# Patient Record
Sex: Female | Born: 1960 | Race: White | Hispanic: No | Marital: Married
Health system: Southern US, Community
[De-identification: ages and names within clinical notes are randomized; demographics above are authoritative.]

---

## 2000-04-08 ENCOUNTER — Other Ambulatory Visit: Admission: RE | Admit: 2000-04-08 | Discharge: 2000-04-08 | Payer: Self-pay | Admitting: Obstetrics and Gynecology

## 2000-09-16 ENCOUNTER — Encounter: Payer: Self-pay | Admitting: Obstetrics and Gynecology

## 2000-09-16 ENCOUNTER — Ambulatory Visit (HOSPITAL_COMMUNITY): Admission: RE | Admit: 2000-09-16 | Discharge: 2000-09-16 | Payer: Self-pay | Admitting: Obstetrics and Gynecology

## 2001-05-05 ENCOUNTER — Other Ambulatory Visit: Admission: RE | Admit: 2001-05-05 | Discharge: 2001-05-05 | Payer: Self-pay | Admitting: Obstetrics and Gynecology

## 2002-08-27 ENCOUNTER — Other Ambulatory Visit: Admission: RE | Admit: 2002-08-27 | Discharge: 2002-08-27 | Payer: Self-pay | Admitting: Obstetrics and Gynecology

## 2003-11-23 ENCOUNTER — Ambulatory Visit: Payer: Self-pay | Admitting: Obstetrics and Gynecology

## 2004-03-29 ENCOUNTER — Emergency Department: Payer: Self-pay | Admitting: Unknown Physician Specialty

## 2004-12-24 ENCOUNTER — Ambulatory Visit: Payer: Self-pay | Admitting: Obstetrics and Gynecology

## 2006-01-02 ENCOUNTER — Ambulatory Visit: Payer: Self-pay | Admitting: Obstetrics and Gynecology

## 2006-01-06 IMAGING — CT CT CERVICAL SPINE WITHOUT CONTRAST
1 series · 12 of 14 positions shown, 15 images · non-contrast
Comparison: none

REASON FOR EXAM: Fall               rm cardiac
COMMENTS:

[Series 3: inspace · axial · 0.35mm/px · z∈[+210,+363]mm · 12 of 258 slices shown, 15 images]
[im 20/258  soft-tissue]
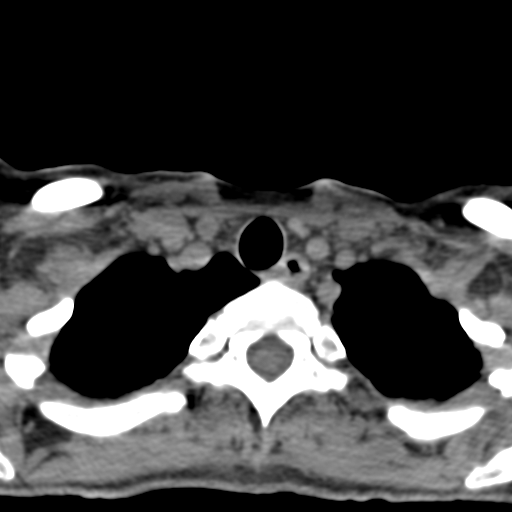
[im 20/258  bone]
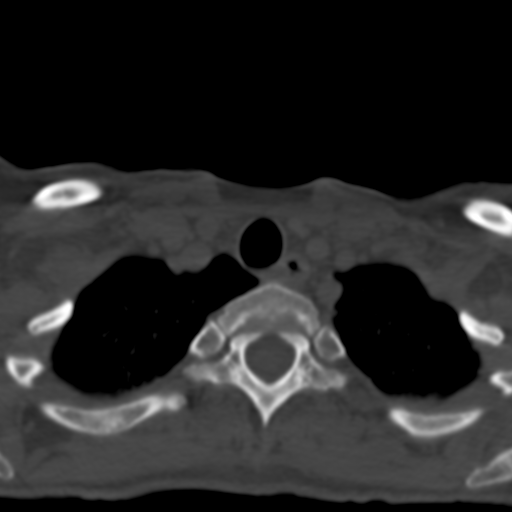
[im 40/258  bone]
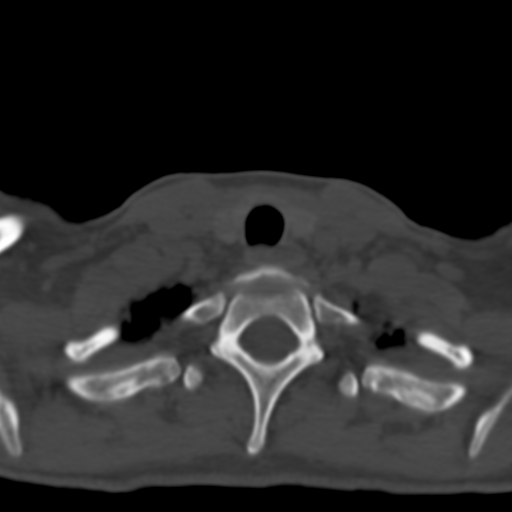
[im 60/258  bone]
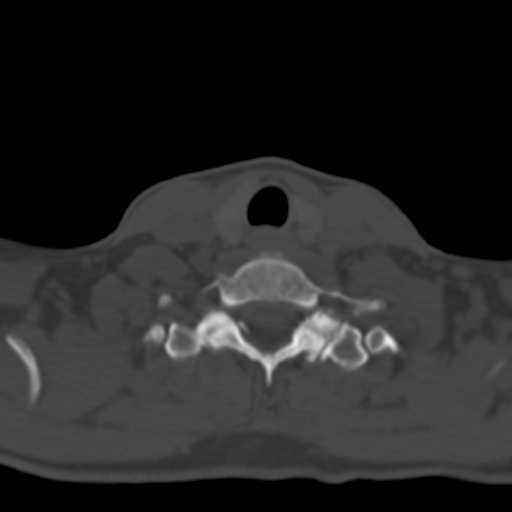
[im 80/258  bone]
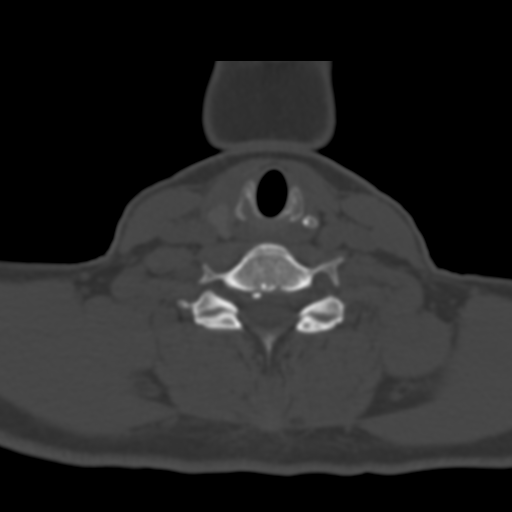
[im 99/258  soft-tissue]
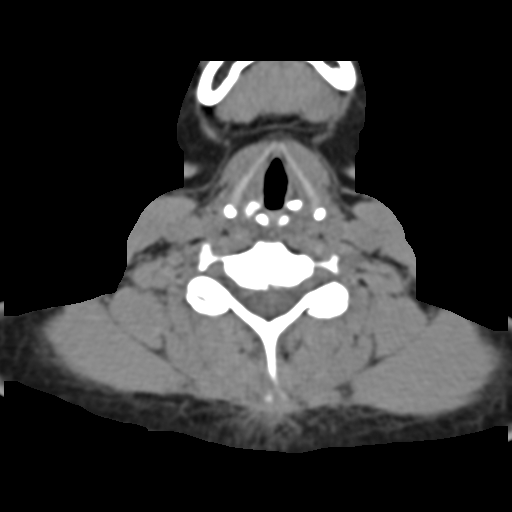
[im 99/258  bone]
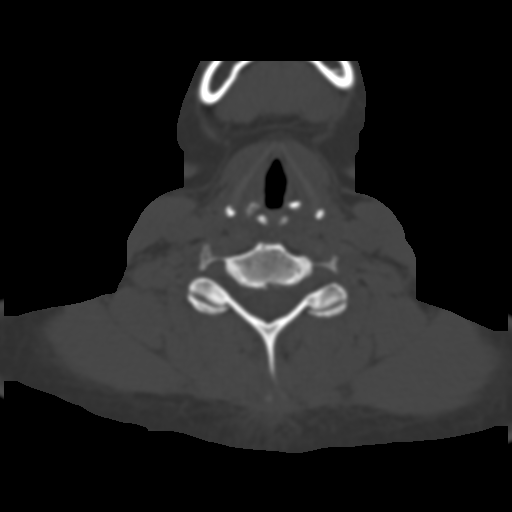
[im 119/258  bone]
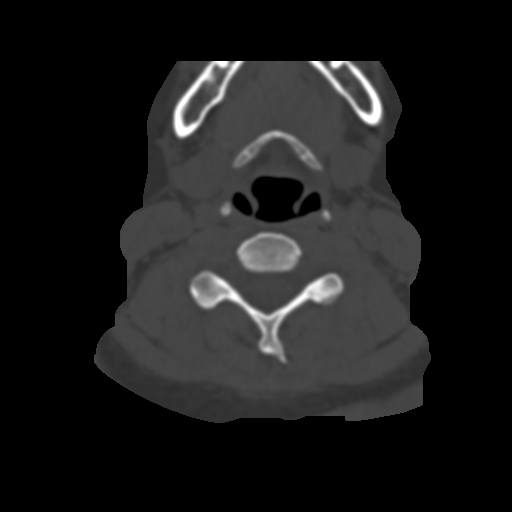
[im 139/258  bone]
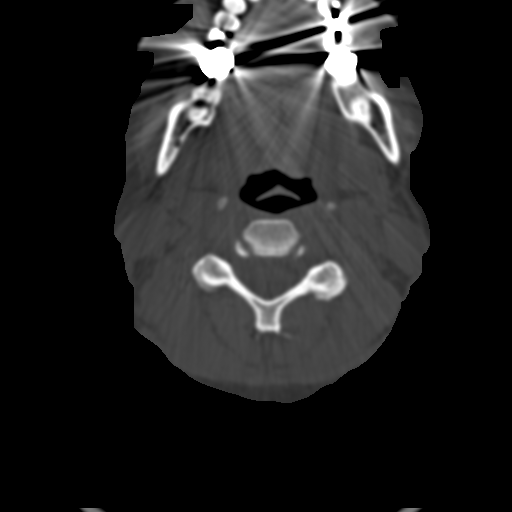
[im 159/258  bone]
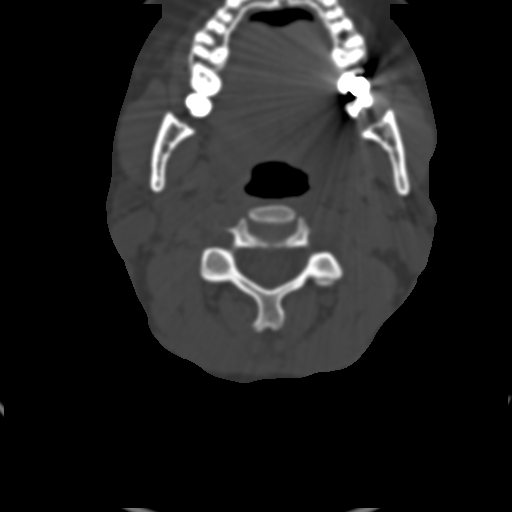
[im 178/258  soft-tissue]
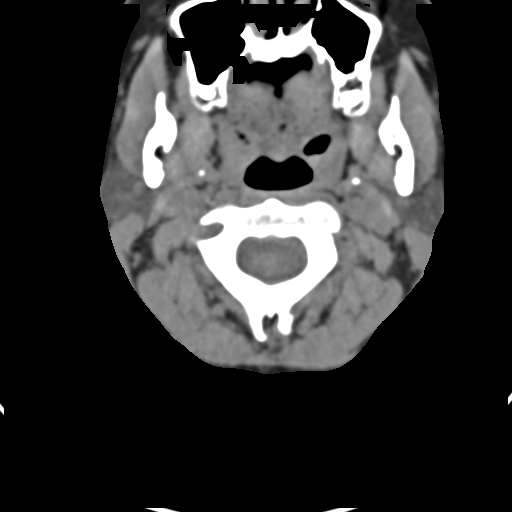
[im 178/258  bone]
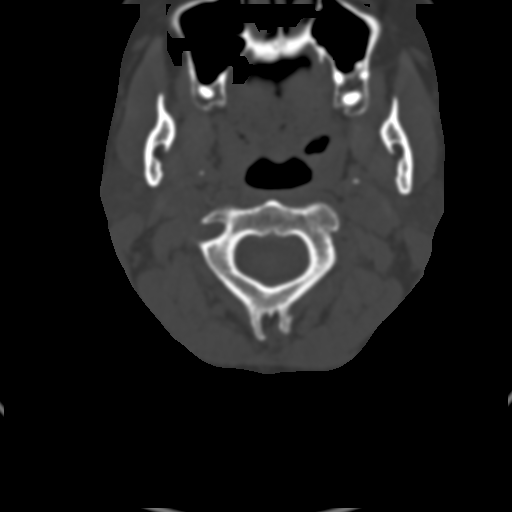
[im 198/258  bone]
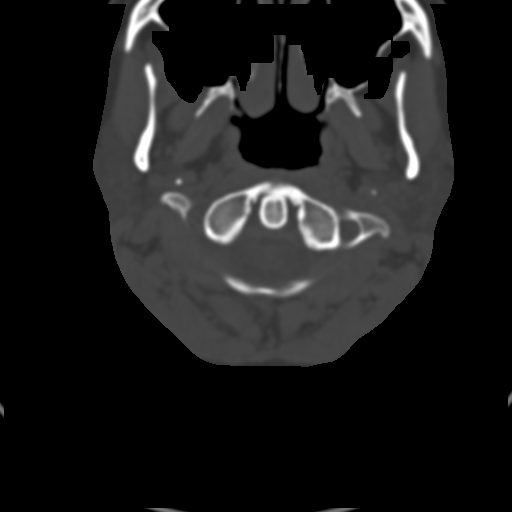
[im 218/258  bone]
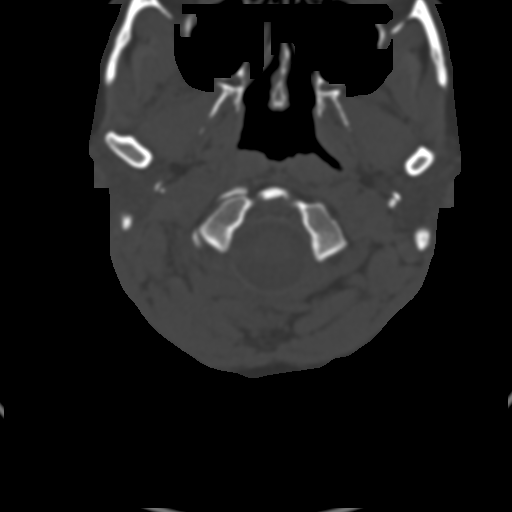
[im 238/258  bone]
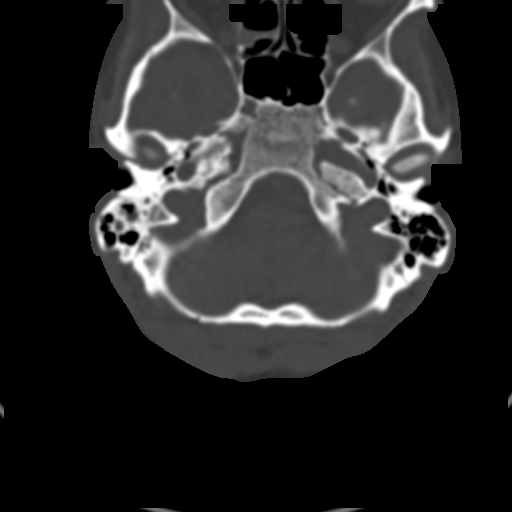

[12 of 14 positions shown; findings below may reference images not displayed]

PROCEDURE:     CT  - CT CERVICAL SPINE WO  - March 29, 2004 [DATE]

RESULT:     Multiplanar images were obtained of the cervical spine utilizing
high definition bone algorithm.

A skull base fracture is appreciated involving the RIGHT occipital condyle.
There is extension into the RIGHT anterior condylar region. These regions of
fracture are nondisplaced. Evaluation of the cervical spine proper
demonstrates no evidence of fracture or dislocation nor evidence of
prevertebral soft tissue swelling.
IMPRESSION: 1.     Fracture along the skull base involving the RIGHT condylar region as
described above. This is a nondisplaced fracture.
2.     No evidence of a cervical spine fracture.
3.     A preliminary report was relayed to Dr. Lika of the Emergency

## 2007-01-06 ENCOUNTER — Ambulatory Visit: Payer: Self-pay | Admitting: Obstetrics and Gynecology

## 2008-01-12 ENCOUNTER — Ambulatory Visit: Payer: Self-pay | Admitting: Obstetrics and Gynecology

## 2009-01-12 ENCOUNTER — Ambulatory Visit: Payer: Self-pay | Admitting: Obstetrics and Gynecology

## 2010-02-02 ENCOUNTER — Ambulatory Visit: Payer: Self-pay | Admitting: Obstetrics and Gynecology

## 2011-04-17 ENCOUNTER — Ambulatory Visit: Payer: Self-pay | Admitting: Obstetrics and Gynecology

## 2012-04-17 ENCOUNTER — Ambulatory Visit: Payer: Self-pay | Admitting: Obstetrics and Gynecology

## 2013-06-04 ENCOUNTER — Ambulatory Visit: Payer: Self-pay | Admitting: Obstetrics and Gynecology

## 2014-10-03 DIAGNOSIS — D239 Other benign neoplasm of skin, unspecified: Secondary | ICD-10-CM

## 2014-10-03 HISTORY — DX: Other benign neoplasm of skin, unspecified: D23.9

## 2019-04-01 ENCOUNTER — Ambulatory Visit: Payer: Self-pay | Attending: Internal Medicine

## 2019-04-01 DIAGNOSIS — Z23 Encounter for immunization: Secondary | ICD-10-CM

## 2019-04-01 NOTE — Progress Notes (Signed)
   Covid-19 Vaccination Clinic  Name:  Alexandria Shelton    MRN: [id number] DOB: 07-Feb-1960  04/01/2019  [name] was observed post Covid-19 immunization for 15 minutes without incident. She was provided with Vaccine Information Sheet and instruction to access the V-Safe system.   [name] was instructed to call 911 with any severe reactions post vaccine: Marland Kitchen Difficulty breathing  . Swelling of face and throat  . A fast heartbeat  . A bad rash all over body  . Dizziness and weakness   Immunizations Administered    Name Date Dose VIS Date Route   Pfizer COVID-19 Vaccine 04/01/2019  8:47 AM 0.3 mL 12/25/2018 Intramuscular   Manufacturer: [place]   Lot: [id number]   [place]: [id number]

## 2019-04-28 ENCOUNTER — Ambulatory Visit: Payer: Self-pay | Attending: Internal Medicine

## 2019-04-28 DIAGNOSIS — Z23 Encounter for immunization: Secondary | ICD-10-CM

## 2019-04-28 NOTE — Progress Notes (Signed)
   Covid-19 Vaccination Clinic  Name:  Alexandria Shelton    MRN: [id number] DOB: [birth year]  04/28/2019  [name] was observed post Covid-19 immunization for 15 minutes without incident. She was provided with Vaccine Information Sheet and instruction to access the V-Safe system.   [name] was instructed to call 911 with any severe reactions post vaccine: Marland Kitchen Difficulty breathing  . Swelling of face and throat  . A fast heartbeat  . A bad rash all over body  . Dizziness and weakness   Immunizations Administered    Name Date Dose VIS Date Route   Pfizer COVID-19 Vaccine 04/28/2019  9:22 AM 0.3 mL 12/25/2018 Intramuscular   Manufacturer: [place]   Lot: [id number]   [place]: [id number]

## 2020-12-18 ENCOUNTER — Other Ambulatory Visit: Payer: Self-pay

## 2020-12-18 ENCOUNTER — Encounter: Payer: Self-pay | Admitting: Dermatology

## 2020-12-18 ENCOUNTER — Ambulatory Visit: Payer: Managed Care, Other (non HMO) | Admitting: Dermatology

## 2020-12-18 DIAGNOSIS — D229 Melanocytic nevi, unspecified: Secondary | ICD-10-CM

## 2020-12-18 DIAGNOSIS — Z86018 Personal history of other benign neoplasm: Secondary | ICD-10-CM

## 2020-12-18 DIAGNOSIS — L570 Actinic keratosis: Secondary | ICD-10-CM | POA: Diagnosis not present

## 2020-12-18 DIAGNOSIS — L814 Other melanin hyperpigmentation: Secondary | ICD-10-CM

## 2020-12-18 DIAGNOSIS — Z85828 Personal history of other malignant neoplasm of skin: Secondary | ICD-10-CM | POA: Diagnosis not present

## 2020-12-18 DIAGNOSIS — Z1283 Encounter for screening for malignant neoplasm of skin: Secondary | ICD-10-CM | POA: Diagnosis not present

## 2020-12-18 DIAGNOSIS — D1801 Hemangioma of skin and subcutaneous tissue: Secondary | ICD-10-CM

## 2020-12-18 DIAGNOSIS — L578 Other skin changes due to chronic exposure to nonionizing radiation: Secondary | ICD-10-CM

## 2020-12-18 DIAGNOSIS — L820 Inflamed seborrheic keratosis: Secondary | ICD-10-CM

## 2020-12-18 DIAGNOSIS — B078 Other viral warts: Secondary | ICD-10-CM

## 2020-12-18 DIAGNOSIS — L821 Other seborrheic keratosis: Secondary | ICD-10-CM

## 2020-12-18 NOTE — Progress Notes (Signed)
Follow-Up Visit   Subjective  Alexandria A Largent is a 60 y.o. female who presents for the following: Annual Exam (Patient here for full body skin exam and skin cancer screening. Patient with hx of dysplastic nevi and BCC. She does have a spot at the bottom of her right foot that she thinks may be a wart. Otherwise no new or changing spots that patient is aware of. ).  The following portions of the chart were reviewed this encounter and updated as appropriate:   Allergies  Meds  Problems  Med Hx  Surg Hx  Fam Hx      Review of Systems:  No other skin or systemic complaints except as noted in HPI or Assessment and Plan.  Objective  Well appearing patient in no apparent distress; mood and affect are within normal limits.  A full examination was performed including scalp, head, eyes, ears, nose, lips, neck, chest, axillae, abdomen, back, buttocks, bilateral upper extremities, bilateral lower extremities, hands, feet, fingers, toes, fingernails, and toenails. All findings within normal limits unless otherwise noted below.  Left Dorsum Hand x 1 Erythematous keratotic or waxy stuck-on papule or plaque.   Left Wrist x 1 Erythematous thin papules/macules with gritty scale.   Right Foot (3) Verrucous papules -- Discussed viral etiology and contagion.    Assessment & Plan  Inflamed seborrheic keratosis Left Dorsum Hand x 1  Destruction of lesion - Left Dorsum Hand x 1 Complexity: simple   Destruction method: cryotherapy   Informed consent: discussed and consent obtained   Timeout:  patient name, date of birth, surgical site, and procedure verified Lesion destroyed using liquid nitrogen: Yes   Region frozen until ice ball extended beyond lesion: Yes   Outcome: patient tolerated procedure well with no complications   Post-procedure details: wound care instructions given    AK (actinic keratosis) Left Wrist x 1  Destruction of lesion - Left Wrist x 1 Complexity: simple    Destruction method: cryotherapy   Informed consent: discussed and consent obtained   Timeout:  patient name, date of birth, surgical site, and procedure verified Lesion destroyed using liquid nitrogen: Yes   Region frozen until ice ball extended beyond lesion: Yes   Outcome: patient tolerated procedure well with no complications   Post-procedure details: wound care instructions given    Other viral warts (3) Right Foot  Discussed viral etiology and risk of spread.  Discussed multiple treatments may be required to clear warts.  Discussed possible post-treatment dyspigmentation and risk of recurrence.  Start [facility] wart paste nightly and cover with band-aid.   Destruction of lesion - Right Foot Complexity: simple   Destruction method: cryotherapy   Informed consent: discussed and consent obtained   Timeout:  patient name, date of birth, surgical site, and procedure verified Lesion destroyed using liquid nitrogen: Yes   Region frozen until ice ball extended beyond lesion: Yes   Outcome: patient tolerated procedure well with no complications   Post-procedure details: wound care instructions given    Skin cancer screening  Lentigines - Scattered tan macules - Due to sun exposure - Benign-appearing, observe - Recommend daily broad spectrum sunscreen SPF 30+ to sun-exposed areas, reapply every 2 hours as needed. - Call for any changes  Seborrheic Keratoses - Stuck-on, waxy, tan-brown papules and/or plaques  - Benign-appearing - Discussed benign etiology and prognosis. - Observe - Call for any changes  Melanocytic Nevi - Tan-brown and/or pink-flesh-colored symmetric macules and papules - Benign appearing on exam today -  Observation - Call clinic for new or changing moles - Recommend daily use of broad spectrum spf 30+ sunscreen to sun-exposed areas.   Hemangiomas - Red papules - Discussed benign nature - Observe - Call for any changes  Actinic Damage - Chronic  condition, secondary to cumulative UV/sun exposure - diffuse scaly erythematous macules with underlying dyspigmentation - Recommend daily broad spectrum sunscreen SPF 30+ to sun-exposed areas, reapply every 2 hours as needed.  - Staying in the shade or wearing long sleeves, sun glasses (UVA+UVB protection) and wide brim hats (4-inch brim around the entire circumference of the hat) are also recommended for sun protection.  - Call for new or changing lesions.  Skin cancer screening performed today.  History of Basal Cell Carcinoma of the Skin - No evidence of recurrence today - Recommend regular full body skin exams - Recommend daily broad spectrum sunscreen SPF 30+ to sun-exposed areas, reapply every 2 hours as needed.  - Call if any new or changing lesions are noted between office visits  History of Dysplastic Nevi - No evidence of recurrence today - Recommend regular full body skin exams - Recommend daily broad spectrum sunscreen SPF 30+ to sun-exposed areas, reapply every 2 hours as needed.  - Call if any new or changing lesions are noted between office visits  Return for Wart 6-8 weeks, 1 year TBSE.  Graciella Belton, RMA, am acting as scribe for Sarina Ser, MD . Documentation: I have reviewed the above documentation for accuracy and completeness, and I agree with the above.  Sarina Ser, MD

## 2020-12-18 NOTE — Patient Instructions (Addendum)
Cryotherapy Aftercare  Wash gently with soap and water everyday.   Apply Vaseline and Band-Aid daily until healed.   Instructions for [facility] Medications  One or more of your medications was sent to the [facility] mail order compounding pharmacy. You will receive an email from them and can purchase the medicine through that link. It will then be mailed to your home at the address you confirmed. If for any reason you do not receive an email from them, please check your spam folder. If you still do not find the email, please let us know. [facility] phone number is [phone].  Start [facility] wart paste nightly and cover.   Melanoma ABCDEs  Melanoma is the most dangerous type of skin cancer, and is the leading cause of death from skin disease.  You are more likely to develop melanoma if you: Have light-colored skin, light-colored eyes, or red or blond hair Spend a lot of time in the sun Tan regularly, either outdoors or in a tanning bed Have had blistering sunburns, especially during childhood Have a close family member who has had a melanoma Have atypical moles or large birthmarks  Early detection of melanoma is key since treatment is typically straightforward and cure rates are extremely high if we catch it early.   The first sign of melanoma is often a change in a mole or a new dark spot.  The ABCDE system is a way of remembering the signs of melanoma.  A for asymmetry:  The two halves do not match. B for border:  The edges of the growth are irregular. C for color:  A mixture of colors are present instead of an even brown color. D for diameter:  Melanomas are usually (but not always) greater than 63mm - the size of a pencil eraser. E for evolution:  The spot keeps changing in size, shape, and color.  Please check your skin once per month between visits. You can use a small mirror in front and a large mirror behind you to keep an eye on the back side or your  body.   If you see any new or changing lesions before your next follow-up, please call to schedule a visit.  Please continue daily skin protection including broad spectrum sunscreen SPF 30+ to sun-exposed areas, reapplying every 2 hours as needed when you're outdoors.    If You Need Anything After Your Visit  If you have any questions or concerns for your doctor, please call our main line at [phone] and press option 4 to reach your doctor's medical assistant. If no one answers, please leave a voicemail as directed and we will return your call as soon as possible. Messages left after 4 pm will be answered the following business day.   You may also send [place] a message via [place]. We typically respond to MyChart messages within 1-2 business days.  For prescription refills, please ask your pharmacy to contact our office. Our fax number is [phone].  If you have an urgent issue when the clinic is closed that cannot wait until the next business day, you can page your doctor at the number below.    Please note that while we do our best to be available for urgent issues outside of office hours, we are not available 24/7.   If you have an urgent issue and are unable to reach [place], you may choose to seek medical care at your doctor's office, retail clinic, urgent care center, or emergency  room.  If you have a medical emergency, please immediately call 911 or go to the emergency department.  Pager Numbers  - Dr. Nehemiah Massed: [phone]  - Dr. Laurence Ferrari: [phone]  - Dr. Nicole Kindred: [phone]  In the event of inclement weather, please call our main line at [phone] for an update on the status of any delays or closures.  Dermatology Medication Tips: Please keep the boxes that topical medications come in in order to help keep track of the instructions about where and how to use these. Pharmacies typically print the medication instructions only on the boxes and not directly on the medication  tubes.   If your medication is too expensive, please contact our office at [phone] option 4 or send [place] a message through [place].   We are unable to tell what your co-pay for medications will be in advance as this is different depending on your insurance coverage. However, we may be able to find a substitute medication at lower cost or fill out paperwork to get insurance to cover a needed medication.   If a prior authorization is required to get your medication covered by your insurance company, please allow [place] 1-2 business days to complete this process.  Drug prices often vary depending on where the prescription is filled and some pharmacies may offer cheaper prices.  The website www.goodrx.com contains coupons for medications through different pharmacies. The prices here do not account for what the cost may be with help from insurance (it may be cheaper with your insurance), but the website can give you the price if you did not use any insurance.  - You can print the associated coupon and take it with your prescription to the pharmacy.  - You may also stop by our office during regular business hours and pick up a GoodRx coupon card.  - If you need your prescription sent electronically to a different pharmacy, notify our office through [facility] or by phone at [phone] option 4.     Si Usted Necesita Algo Despus de Su Visita  Tambin puede enviarnos un mensaje a travs de Pharmacist, community. Por lo general respondemos a los mensajes de MyChart en el transcurso de 1 a 2 das hbiles.  Para renovar recetas, por favor pida a su farmacia que se ponga en contacto con nuestra oficina. Harland Dingwall de fax es [place] [phone].  Si tiene un asunto urgente cuando la clnica est cerrada y que no puede esperar hasta el siguiente da hbil, puede llamar/localizar a su doctor(a) al nmero que aparece a continuacin.   Por favor, tenga en cuenta que aunque hacemos todo lo posible para estar  disponibles para asuntos urgentes fuera del horario de [place], no estamos disponibles las 24 horas del da, los 7 das de la [place].   Si tiene un problema urgente y no puede comunicarse con nosotros, puede optar por buscar atencin mdica  en el consultorio de su doctor(a), en una clnica privada, en un centro de atencin urgente o en una sala de emergencias.  Si tiene Engineering geologist, por favor llame inmediatamente al 911 o vaya a la sala de emergencias.  Nmeros de bper  - Dr. Nehemiah Massed: [phone]  - Dra. Moye: [phone]  - Dra. Nicole Kindred: [phone]  En caso de inclemencias del [place], por favor llame a Johnsie Kindred principal al [phone] para una actualizacin sobre el [place] de cualquier retraso o cierre.  Consejos para la medicacin en dermatologa: Por favor, guarde las cajas en las que vienen los  medicamentos de uso tpico para ayudarle a seguir las [employer] dnde y cmo usarlos. Las farmacias generalmente imprimen las instrucciones del medicamento slo en las cajas y no directamente en los tubos del [place].   Si su medicamento es muy caro, por favor, pngase en contacto con Zigmund Daniel llamando al [phone] y presione la opcin 4 o envenos un mensaje a travs de Pharmacist, community.   No podemos decirle cul ser su copago por los medicamentos por adelantado ya que esto es diferente dependiendo de la cobertura de su seguro. Sin embargo, es posible que podamos encontrar un medicamento sustituto a Electrical engineer un formulario para que el seguro cubra el medicamento que se considera necesario.   Si se requiere una autorizacin previa para que su compaa de seguros [place] su medicamento, por favor permtanos de 1 a 2 das hbiles para completar este proceso.  Los precios de los medicamentos varan con frecuencia dependiendo del Environmental consultant de dnde se surte la receta y alguna farmacias pueden ofrecer precios ms baratos.  El sitio web www.goodrx.com tiene  cupones para medicamentos de Airline pilot. Los precios aqu no tienen en cuenta lo que podra costar con la ayuda del seguro (puede ser ms barato con su seguro), pero el sitio web puede darle el precio si no utiliz Research scientist (physical sciences).  - Puede imprimir el cupn correspondiente y llevarlo con su receta a la farmacia.  - Tambin puede pasar por nuestra oficina durante el horario de atencin regular y Charity fundraiser una tarjeta de cupones de GoodRx.  - Si necesita que su receta se enve electrnicamente a una farmacia diferente, informe a nuestra oficina a travs de MyChart de [place] o por telfono llamando al [phone] y presione la opcin 4.

## 2020-12-27 ENCOUNTER — Encounter: Payer: Self-pay | Admitting: Dermatology

## 2021-02-05 ENCOUNTER — Other Ambulatory Visit: Payer: Self-pay

## 2021-02-05 ENCOUNTER — Ambulatory Visit (INDEPENDENT_AMBULATORY_CARE_PROVIDER_SITE_OTHER): Payer: Managed Care, Other (non HMO) | Admitting: Dermatology

## 2021-02-05 DIAGNOSIS — L280 Lichen simplex chronicus: Secondary | ICD-10-CM

## 2021-02-05 DIAGNOSIS — L820 Inflamed seborrheic keratosis: Secondary | ICD-10-CM | POA: Diagnosis not present

## 2021-02-05 DIAGNOSIS — L988 Other specified disorders of the skin and subcutaneous tissue: Secondary | ICD-10-CM | POA: Diagnosis not present

## 2021-02-05 DIAGNOSIS — B070 Plantar wart: Secondary | ICD-10-CM | POA: Diagnosis not present

## 2021-02-05 NOTE — Patient Instructions (Signed)

## 2021-02-05 NOTE — Progress Notes (Signed)
° °  Follow-Up Visit   Subjective  Alexandria Shelton is a 61 y.o. female who presents for the following: Warts (6 weeks f/u on warts on the right foot ). The patient has spots, moles and lesions to be evaluated, some may be new or changing and the patient has concerns that these could be cancer.   The following portions of the chart were reviewed this encounter and updated as appropriate:   Allergies   Meds   Problems   Med Hx   Surg Hx   Fam Hx       Review of Systems:  No other skin or systemic complaints except as noted in HPI or Assessment and Plan.  Objective  Well appearing patient in no apparent distress; mood and affect are within normal limits.  A focused examination was performed including hands, wrist, right foot, neck. Relevant physical exam findings are noted in the Assessment and Plan.  left hand, right wrist Non healing wound   left proximal mandible x 1 Stuck-on, waxy, tan-brown papule   face Rhytides and volume loss.   right foot x 1 Verrucous papules -- Discussed viral etiology and contagion.    Assessment & Plan  Lichen simplex chronicus left hand, right wrist  Chronic and persistent   Cleansed skin with Puracyn, applied thin DuoDerm pads  Inflamed seborrheic keratosis left proximal mandible x 1  Reassured benign age-related growth.  Recommend observation.  Discussed cryotherapy if spot(s) become irritated or inflamed.   Destruction of lesion - left proximal mandible x 1 Complexity: simple   Destruction method: cryotherapy   Informed consent: discussed and consent obtained   Timeout:  patient name, date of birth, surgical site, and procedure verified Lesion destroyed using liquid nitrogen: Yes   Region frozen until ice ball extended beyond lesion: Yes   Outcome: patient tolerated procedure well with no complications   Post-procedure details: wound care instructions given    Elastosis of skin face  Recommend botox  Frown complex 27.5 units   Forehead  5 units   Plantar wart right foot x 1  Discussed viral etiology and risk of spread.  Discussed multiple treatments may be required to clear warts.  Discussed possible post-treatment dyspigmentation and risk of recurrence.   Start [facility] wart paste after blistering process from today's procedure   Destruction of lesion - right foot x 1  Destruction method: cryotherapy   Informed consent: discussed and consent obtained   Lesion destroyed using liquid nitrogen: Yes   Region frozen until ice ball extended beyond lesion: Yes   Outcome: patient tolerated procedure well with no complications   Post-procedure details: wound care instructions given     Return for for botox .  IMarye Round, CMA, am acting as scribe for Sarina Ser, MD .  Documentation: I have reviewed the above documentation for accuracy and completeness, and I agree with the above.  Sarina Ser, MD

## 2021-02-08 ENCOUNTER — Encounter: Payer: Self-pay | Admitting: Dermatology

## 2021-02-21 ENCOUNTER — Ambulatory Visit (INDEPENDENT_AMBULATORY_CARE_PROVIDER_SITE_OTHER): Payer: Self-pay | Admitting: Dermatology

## 2021-02-21 ENCOUNTER — Other Ambulatory Visit: Payer: Self-pay

## 2021-02-21 DIAGNOSIS — L988 Other specified disorders of the skin and subcutaneous tissue: Secondary | ICD-10-CM

## 2021-02-21 NOTE — Progress Notes (Signed)
° °  Follow-Up Visit   Subjective  Alexandria Shelton is a 61 y.o. female who presents for the following: Facial Elastosis (Patient here today for botox. Patient interested forehead. ).  The following portions of the chart were reviewed this encounter and updated as appropriate:  Allergies   Meds   Problems   Med Hx   Surg Hx   Fam Hx       Review of Systems: No other skin or systemic complaints except as noted in HPI or Assessment and Plan.  Objective  Well appearing patient in no apparent distress; mood and affect are within normal limits.  A focused examination was performed including face. Relevant physical exam findings are noted in the Assessment and Plan.  face Rhytides and volume loss.                         Assessment & Plan  Elastosis of skin face  Injected today  Botox at forehead and frown complex 28.5 units  Photos  Start prescribe [facility] Anti-Aging Tretinoin 0.025%/Niacinamide/Vitamin C/Vitamin E/Turmeric/Resveratrol with Hyaluronic Acid. Apply pea sized amount nightly to the entire face.  The patient was advised this is not covered by insurance since it is made by a compounding pharmacy. They will receive an email to check out and the medication will be mailed to their home.   Topical retinoid medications like tretinoin can cause dryness and irritation when first started. Only apply a pea-sized amount to the entire affected area. Avoid applying it around the eyes, edges of mouth and creases at the nose. If you experience irritation, use a good moisturizer first and/or apply the medicine less often. If you are doing well with the medicine, you can increase how often you use it until you are applying every night. Be careful with sun protection while using this medication as it can make you sensitive to the sun. This medicine should not be used by pregnant women.   Recommend Mid face Filler - recommend 1 syringe of voluma   Botox Injection -  face Location: See attached image  Informed consent: Discussed risks (infection, pain, bleeding, bruising, swelling, allergic reaction, paralysis of nearby muscles, eyelid droop, double vision, neck weakness, difficulty breathing, headache, undesirable cosmetic result, and need for additional treatment) and benefits of the procedure, as well as the alternatives.  Informed consent was obtained.  Preparation: The area was cleansed with alcohol.  Procedure Details:  Botox was injected into the dermis with a 30-gauge needle. Pressure applied to any bleeding. Ice packs offered for swelling.  Lot Number:  [id number] Expiration:  03/2023  Total Units Injected:  28.5  Plan: Tylenol may be used for headache.  Allow 2 weeks before returning to clinic for additional dosing as needed. Patient will call for any problems.    Return for 2 - 3 week follow up on botox .  I, Ruthell Rummage, CMA, am acting as scribe for Forest Gleason, MD.  Documentation: I have reviewed the above documentation for accuracy and completeness, and I agree with the above.  Forest Gleason, MD

## 2021-02-21 NOTE — Patient Instructions (Addendum)
Recommend daily broad spectrum sunscreen SPF 30+ to sun-exposed areas, reapply every 2 hours as needed. Call for new or changing lesions.  Staying in the shade or wearing long sleeves, sun glasses (UVA+UVB protection) and wide brim hats (4-inch brim around the entire circumference of the hat) are also recommended for sun protection.     Recommend starting with a pea sized amount every other night  Start [facility] Anti-Aging Tretinoin 0.025%/Niacinamide/Vitamin C/Vitamin E/Turmeric/Resveratrol with Hyaluronic Acid. Apply pea sized amount nightly to the entire face.  The patient was advised this is not covered by insurance since it is made by a compounding pharmacy. They will receive an email to check out and the medication will be mailed to their home.   Topical retinoid medications like tretinoin can cause dryness and irritation when first started. Only apply a pea-sized amount to the entire affected area. Avoid applying it around the eyes, edges of mouth and creases at the nose. If you experience irritation, use a good moisturizer first and/or apply the medicine less often. If you are doing well with the medicine, you can increase how often you use it until you are applying every night. Be careful with sun protection while using this medication as it can make you sensitive to the sun. This medicine should not be used by pregnant women.   Instructions for [facility] Medications  One or more of your medications was sent to the [facility] mail order compounding pharmacy. You will receive an email from them and can purchase the medicine through that link. It will then be mailed to your home at the address you confirmed. If for any reason you do not receive an email from them, please check your spam folder. If you still do not find the email, please let us know. [facility] phone number is [phone].   Topical retinoid medications like tretinoin/Retin-A, adapalene/Differin,  tazarotene/Fabior, and Epiduo/Epiduo Forte can cause dryness and irritation when first started. Only apply a pea-sized amount to the entire affected area. Avoid applying it around the eyes, edges of mouth and creases at the nose. If you experience irritation, use a good moisturizer first and/or apply the medicine less often. If you are doing well with the medicine, you can increase how often you use it until you are applying every night. Be careful with sun protection while using this medication as it can make you sensitive to the sun. This medicine should not be used by pregnant women.    If You Need Anything After Your Visit  If you have any questions or concerns for your doctor, please call our main line at [phone] and press option 4 to reach your doctor's medical assistant. If no one answers, please leave a voicemail as directed and we will return your call as soon as possible. Messages left after 4 pm will be answered the following business day.   You may also send [place] a message via [place]. We typically respond to MyChart messages within 1-2 business days.  For prescription refills, please ask your pharmacy to contact our office. Our fax number is [phone].  If you have an urgent issue when the clinic is closed that cannot wait until the next business day, you can page your doctor at the number below.    Please note that while we do our best to be available for urgent issues outside of office hours, we are not available 24/7.   If you have an urgent issue and are unable to reach  [place], you may choose to seek medical care at your doctor's office, retail clinic, urgent care center, or emergency room.  If you have a medical emergency, please immediately call 911 or go to the emergency department.  Pager Numbers  - Dr. Nehemiah Massed: [phone]  - Dr. Laurence Ferrari: [phone]  - Dr. Nicole Kindred: [phone]  In the event of inclement weather, please call our main line at [phone] for an  update on the status of any delays or closures.  Dermatology Medication Tips: Please keep the boxes that topical medications come in in order to help keep track of the instructions about where and how to use these. Pharmacies typically print the medication instructions only on the boxes and not directly on the medication tubes.   If your medication is too expensive, please contact our office at [phone] option 4 or send [place] a message through [place].   We are unable to tell what your co-pay for medications will be in advance as this is different depending on your insurance coverage. However, we may be able to find a substitute medication at lower cost or fill out paperwork to get insurance to cover a needed medication.   If a prior authorization is required to get your medication covered by your insurance company, please allow [place] 1-2 business days to complete this process.  Drug prices often vary depending on where the prescription is filled and some pharmacies may offer cheaper prices.  The website www.goodrx.com contains coupons for medications through different pharmacies. The prices here do not account for what the cost may be with help from insurance (it may be cheaper with your insurance), but the website can give you the price if you did not use any insurance.  - You can print the associated coupon and take it with your prescription to the pharmacy.  - You may also stop by our office during regular business hours and pick up a GoodRx coupon card.  - If you need your prescription sent electronically to a different pharmacy, notify our office through [facility] or by phone at [phone] option 4.     Si Usted Necesita Algo Despus de Su Visita  Tambin puede enviarnos un mensaje a travs de Pharmacist, community. Por lo general respondemos a los mensajes de MyChart en el transcurso de 1 a 2 das hbiles.  Para renovar recetas, por favor pida a su farmacia que se ponga en contacto con  nuestra oficina. Harland Dingwall de fax es [place] [phone].  Si tiene un asunto urgente cuando la clnica est cerrada y que no puede esperar hasta el siguiente da hbil, puede llamar/localizar a su doctor(a) al nmero que aparece a continuacin.   Por favor, tenga en cuenta que aunque hacemos todo lo posible para estar disponibles para asuntos urgentes fuera del horario de [place], no estamos disponibles las 24 horas del da, los 7 das de la [place].   Si tiene un problema urgente y no puede comunicarse con nosotros, puede optar por buscar atencin mdica  en el consultorio de su doctor(a), en una clnica privada, en un centro de atencin urgente o en una sala de emergencias.  Si tiene Engineering geologist, por favor llame inmediatamente al 911 o vaya a la sala de emergencias.  Nmeros de bper  - Dr. Nehemiah Massed: [phone]  - Dra. Moye: [phone]  - Dra. Nicole Kindred: [phone]  En caso de inclemencias del [place], por favor llame a Johnsie Kindred principal al [phone] para una actualizacin sobre el [place] de cualquier [place]  o cierre.  Consejos para la medicacin en dermatologa: Por favor, guarde las cajas en las que vienen los medicamentos de uso tpico para ayudarle a seguir las instrucciones sobre dnde y cmo usarlos. Las farmacias generalmente imprimen las instrucciones del medicamento slo en las cajas y no directamente en los tubos del [place].   Si su medicamento es muy caro, por favor, pngase en contacto con Zigmund Daniel llamando al [phone] y presione la opcin 4 o envenos un mensaje a travs de Pharmacist, community.   No podemos decirle cul ser su copago por los medicamentos por adelantado ya que esto es diferente dependiendo de la cobertura de su seguro. Sin embargo, es posible que podamos encontrar un medicamento sustituto a Electrical engineer un formulario para que el seguro cubra el medicamento que se considera necesario.   Si se requiere una autorizacin  previa para que su compaa de seguros [place] su medicamento, por favor permtanos de 1 a 2 das hbiles para completar este proceso.  Los precios de los medicamentos varan con frecuencia dependiendo del Environmental consultant de dnde se surte la receta y alguna farmacias pueden ofrecer precios ms baratos.  El sitio web www.goodrx.com tiene cupones para medicamentos de Airline pilot. Los precios aqu no tienen en cuenta lo que podra costar con la ayuda del seguro (puede ser ms barato con su seguro), pero el sitio web puede darle el precio si no utiliz Research scientist (physical sciences).  - Puede imprimir el cupn correspondiente y llevarlo con su receta a la farmacia.  - Tambin puede pasar por nuestra oficina durante el horario de atencin regular y Charity fundraiser una tarjeta de cupones de GoodRx.  - Si necesita que su receta se enve electrnicamente a una farmacia diferente, informe a nuestra oficina a travs de MyChart de [place] o por telfono llamando al [phone] y presione la opcin 4.

## 2021-02-28 ENCOUNTER — Encounter: Payer: Self-pay | Admitting: Dermatology

## 2021-03-14 ENCOUNTER — Ambulatory Visit: Payer: Managed Care, Other (non HMO) | Admitting: Dermatology

## 2021-03-20 ENCOUNTER — Ambulatory Visit: Payer: Managed Care, Other (non HMO) | Admitting: Dermatology

## 2021-09-26 ENCOUNTER — Ambulatory Visit: Payer: Managed Care, Other (non HMO) | Admitting: Dermatology

## 2021-11-01 ENCOUNTER — Ambulatory Visit: Payer: Managed Care, Other (non HMO) | Admitting: Dermatology

## 2021-11-01 DIAGNOSIS — Z1283 Encounter for screening for malignant neoplasm of skin: Secondary | ICD-10-CM | POA: Diagnosis not present

## 2021-11-01 DIAGNOSIS — Z86018 Personal history of other benign neoplasm: Secondary | ICD-10-CM

## 2021-11-01 DIAGNOSIS — L578 Other skin changes due to chronic exposure to nonionizing radiation: Secondary | ICD-10-CM

## 2021-11-01 DIAGNOSIS — L570 Actinic keratosis: Secondary | ICD-10-CM | POA: Diagnosis not present

## 2021-11-01 DIAGNOSIS — L905 Scar conditions and fibrosis of skin: Secondary | ICD-10-CM | POA: Diagnosis not present

## 2021-11-01 DIAGNOSIS — D229 Melanocytic nevi, unspecified: Secondary | ICD-10-CM

## 2021-11-01 DIAGNOSIS — D2372 Other benign neoplasm of skin of left lower limb, including hip: Secondary | ICD-10-CM

## 2021-11-01 DIAGNOSIS — L814 Other melanin hyperpigmentation: Secondary | ICD-10-CM

## 2021-11-01 DIAGNOSIS — L821 Other seborrheic keratosis: Secondary | ICD-10-CM

## 2021-11-01 MED ORDER — FLUOROURACIL 5 % EX CREA: TOPICAL_CREAM | Freq: Two times a day (BID) | CUTANEOUS | 0 refills | Status: AC

## 2021-11-01 NOTE — Patient Instructions (Addendum)
- Start 5-fluorouracil cream twice a day for 7 days to affected areas including R forehead, R cheek, R lateral brow, R brow, R preauricular, 14 days to R dorsal hand, L top of shoulder.  Reviewed course of treatment and expected reaction.  Patient advised to expect inflammation and crusting and advised that erosions are possible.  Patient advised to be diligent with sun protection during and after treatment. Handout with details of how to apply medication and what to expect provided. Counseled to keep medication out of reach of children and pets.  Recommend taking Heliocare sun protection supplement daily in sunny weather for additional sun protection. For maximum protection on the sunniest days, you can take up to 2 capsules of regular Heliocare OR take 1 capsule of Heliocare Ultra. For prolonged exposure (such as a full day in the sun), you can repeat your dose of the supplement 4 hours after your first dose. Heliocare can be purchased at [employer], at some [facility] or at [email or web address].    Melanoma ABCDEs  Melanoma is the most dangerous type of skin cancer, and is the leading cause of death from skin disease.  You are more likely to develop melanoma if you: Have light-colored skin, light-colored eyes, or red or blond hair Spend a lot of time in the sun Tan regularly, either outdoors or in a tanning bed Have had blistering sunburns, especially during childhood Have a close family member who has had a melanoma Have atypical moles or large birthmarks  Early detection of melanoma is key since treatment is typically straightforward and cure rates are extremely high if we catch it early.   The first sign of melanoma is often a change in a mole or a new dark spot.  The ABCDE system is a way of remembering the signs of melanoma.  A for asymmetry:  The two halves do not match. B for border:  The edges of the growth are irregular. C for color:  A mixture of colors are present instead of  an even brown color. D for diameter:  Melanomas are usually (but not always) greater than 44m - the size of a pencil eraser. E for evolution:  The spot keeps changing in size, shape, and color.  Please check your skin once per month between visits. You can use a small mirror in front and a large mirror behind you to keep an eye on the back side or your body.   If you see any new or changing lesions before your next follow-up, please call to schedule a visit.  Please continue daily skin protection including broad spectrum sunscreen SPF 30+ to sun-exposed areas, reapplying every 2 hours as needed when you're outdoors.    Due to recent changes in healthcare laws, you may see results of your pathology and/or laboratory studies on MyChart before the doctors have had a chance to review them. We understand that in some cases there may be results that are confusing or concerning to you. Please understand that not all results are received at the same time and often the doctors may need to interpret multiple results in order to provide you with the best plan of care or course of treatment. Therefore, we ask that you please give u[place]2 business days to thoroughly review all your results before contacting the office for clarification. Should we see a critical lab result, you will be contacted sooner.   If You Need Anything After Your Visit  If you have any questions or  concerns for your doctor, please call our main line at [phone] and press option 4 to reach your doctor's medical assistant. If no one answers, please leave a voicemail as directed and we will return your call as soon as possible. Messages left after 4 pm will be answered the following business day.   You may also send [place] a message via [place]. We typically respond to MyChart messages within 1-2 business days.  For prescription refills, please ask your pharmacy to contact our office. Our fax number is [phone].  If you have an urgent  issue when the clinic is closed that cannot wait until the next business day, you can page your doctor at the number below.    Please note that while we do our best to be available for urgent issues outside of office hours, we are not available 24/7.   If you have an urgent issue and are unable to reach [place], you may choose to seek medical care at your doctor's office, retail clinic, urgent care center, or emergency room.  If you have a medical emergency, please immediately call 911 or go to the emergency department.  Pager Numbers  - Dr. Nehemiah Massed: [phone]  - Dr. Laurence Ferrari: [phone]  - Dr. Nicole Kindred: [phone]  In the event of inclement weather, please call our main line at [phone] for an update on the status of any delays or closures.  Dermatology Medication Tips: Please keep the boxes that topical medications come in in order to help keep track of the instructions about where and how to use these. Pharmacies typically print the medication instructions only on the boxes and not directly on the medication tubes.   If your medication is too expensive, please contact our office at [phone] option 4 or send [place] a message through [place].   We are unable to tell what your co-pay for medications will be in advance as this is different depending on your insurance coverage. However, we may be able to find a substitute medication at lower cost or fill out paperwork to get insurance to cover a needed medication.   If a prior authorization is required to get your medication covered by your insurance company, please allow [place] 1-2 business days to complete this process.  Drug prices often vary depending on where the prescription is filled and some pharmacies may offer cheaper prices.  The website www.goodrx.com contains coupons for medications through different pharmacies. The prices here do not account for what the cost may be with help from insurance (it may be cheaper with your  insurance), but the website can give you the price if you did not use any insurance.  - You can print the associated coupon and take it with your prescription to the pharmacy.  - You may also stop by our office during regular business hours and pick up a GoodRx coupon card.  - If you need your prescription sent electronically to a different pharmacy, notify our office through [facility] or by phone at [phone] option 4.     Si Usted Necesita Algo Despus de Su Visita  Tambin puede enviarnos un mensaje a travs de Pharmacist, community. Por lo general respondemos a los mensajes de MyChart en el transcurso de 1 a 2 das hbiles.  Para renovar recetas, por favor pida a su farmacia que se ponga en contacto con nuestra oficina. Harland Dingwall de fax es [place] [phone].  Si tiene un asunto urgente cuando la clnica est cerrada y que no puede esperar  hasta el siguiente da hbil, puede llamar/localizar a su doctor(a) al nmero que aparece a continuacin.   Por favor, tenga en cuenta que aunque hacemos todo lo posible para estar disponibles para asuntos urgentes fuera del horario de [place], no estamos disponibles las 24 horas del da, los 7 das de la [place].   Si tiene un problema urgente y no puede comunicarse con nosotros, puede optar por buscar atencin mdica  en el consultorio de su doctor(a), en una clnica privada, en un centro de atencin urgente o en una sala de emergencias.  Si tiene Engineering geologist, por favor llame inmediatamente al 911 o vaya a la sala de emergencias.  Nmeros de bper  - Dr. Nehemiah Massed: [phone]  - Dra. Moye: [phone]  - Dra. Nicole Kindred: [phone]  En caso de inclemencias del [place], por favor llame a Johnsie Kindred principal al [phone] para una actualizacin sobre el [place] de cualquier [place]raso o cierre.  Consejos para la medicacin en dermatologa: Por favor, guarde las cajas en las que vienen los medicamentos de uso tpico para ayudarle a  seguir las instrucciones sobre dnde y cmo usarlos. Las farmacias generalmente imprimen las instrucciones del medicamento slo en las cajas y no directamente en los tubos del [place].   Si su medicamento es muy caro, por favor, pngase en contacto con Zigmund Daniel llamando al [phone] y presione la opcin 4 o envenos un mensaje a travs de Pharmacist, community.   No podemos decirle cul ser su copago por los medicamentos por adelantado ya que esto es diferente dependiendo de la cobertura de su seguro. Sin embargo, es posible que podamos encontrar un medicamento sustituto a Electrical engineer un formulario para que el seguro cubra el medicamento que se considera necesario.   Si se requiere una autorizacin previa para que su compaa de seguros [place] su medicamento, por favor permtanos de 1 a 2 das hbiles para completar este proceso.  Los precios de los medicamentos varan con frecuencia dependiendo del Environmental consultant de dnde se surte la receta y alguna farmacias pueden ofrecer precios ms baratos.  El sitio web www.goodrx.com tiene cupones para medicamentos de Airline pilot. Los precios aqu no tienen en cuenta lo que podra costar con la ayuda del seguro (puede ser ms barato con su seguro), pero el sitio web puede darle el precio si no utiliz Research scientist (physical sciences).  - Puede imprimir el cupn correspondiente y llevarlo con su receta a la farmacia.  - Tambin puede pasar por nuestra oficina durante el horario de atencin regular y Charity fundraiser una tarjeta de cupones de GoodRx.  - Si necesita que su receta se enve electrnicamente a una farmacia diferente, informe a nuestra oficina a travs de MyChart de [place] o por telfono llamando al [phone] y presione la opcin 4.

## 2021-11-01 NOTE — Progress Notes (Addendum)
Follow-Up Visit   Subjective  Alexandria Shelton is a 61 y.o. female who presents for the following: Annual Exam (Patient with hx of dysplastic nevi. She does have a spot at left temple that came up about 6 weeks ago but has scratched it off it has since healed. Also with a darker spot at right lower leg. ).  The patient presents for Total-Body Skin Exam (TBSE) for skin cancer screening and mole check.  The patient has spots, moles and lesions to be evaluated, some may be new or changing and the patient has concerns that these could be cancer.  Family history of skin cancer - what type(s): melanoma - who affected: father  Family history of skin cancer - what type(s): SCC - who affected: mother  The following portions of the chart were reviewed this encounter and updated as appropriate:   Allergies  Meds  Problems  Med Hx  Surg Hx  Fam Hx      Review of Systems:  No other skin or systemic complaints except as noted in HPI or Assessment and Plan.  Objective  Well appearing patient in no apparent distress; mood and affect are within normal limits.  A full examination was performed including scalp, head, eyes, ears, nose, lips, neck, chest, axillae, abdomen, back, buttocks, bilateral upper extremities, bilateral lower extremities, hands, feet, fingers, toes, fingernails, and toenails. All findings within normal limits unless otherwise noted below.  left temple Dyspigmented smooth macule or patch without features suspicious for malignancy on dermoscopy   R forehead, R cheek, R lateral brow, R brow, R preauricular, R dorsal hand, L top of shoulder Erythematous thin papules/macules with gritty scale.     Assessment & Plan  Scar left temple  Biopsied years ago by previous dermatologist. Tx'd with [place] Continue to monitor, call for changes  AK (actinic keratosis) R forehead, R cheek, R lateral brow, R brow, R preauricular, R dorsal hand, L top of shoulder  Actinic  keratoses are precancerous spots that appear secondary to cumulative UV radiation exposure/sun exposure over time. They are chronic with expected duration over 1 year. A portion of actinic keratoses will progress to squamous cell carcinoma of the skin. It is not possible to reliably predict which spots will progress to skin cancer and so treatment is recommended to prevent development of skin cancer.  Recommend daily broad spectrum sunscreen SPF 30+ to sun-exposed areas, reapply every 2 hours as needed.  Recommend staying in the shade or wearing long sleeves, sun glasses (UVA+UVB protection) and wide brim hats (4-inch brim around the entire circumference of the hat). Call for new or changing lesions.  Discussed cryotherapy vs topical therapy  - Start 5-fluorouracil cream twice a day for 7 days to affected areas including R forehead, R cheek, R lateral brow, R brow, R preauricular, 14 days to R dorsal hand, L top of shoulder.  Reviewed course of treatment and expected reaction.  Patient advised to expect inflammation and crusting and advised that erosions are possible.  Patient advised to be diligent with sun protection during and after treatment. Handout with details of how to apply medication and what to expect provided. Counseled to keep medication out of reach of children and pets.     History of Dysplastic Nevi - No evidence of recurrence today - Recommend regular full body skin exams - Recommend daily broad spectrum sunscreen SPF 30+ to sun-exposed areas, reapply every 2 hours as needed.  - Call if any new or changing lesions  are noted between office visits  Lentigines - Scattered tan macules - Due to sun exposure - Benign-appearing, observe - Recommend daily broad spectrum sunscreen SPF 30+ to sun-exposed areas, reapply every 2 hours as needed. - Call for any changes  Seborrheic Keratoses - Stuck-on, waxy, tan-brown papules and/or plaques  - Benign-appearing - Discussed benign  etiology and prognosis. - Observe - Call for any changes  Melanocytic Nevi - Tan-brown and/or pink-flesh-colored symmetric macules and papules - Benign appearing on exam today - Observation - Call clinic for new or changing moles - Recommend daily use of broad spectrum spf 30+ sunscreen to sun-exposed areas.   Hemangiomas - Red papules - Discussed benign nature - Observe - Call for any changes  Actinic Damage - chronic, secondary to cumulative UV radiation exposure/sun exposure over time - diffuse scaly erythematous macules with underlying dyspigmentation - Recommend daily broad spectrum sunscreen SPF 30+ to sun-exposed areas, reapply every 2 hours as needed.  - Recommend staying in the shade or wearing long sleeves, sun glasses (UVA+UVB protection) and wide brim hats (4-inch brim around the entire circumference of the hat). - Call for new or changing lesions.   Skin cancer screening performed today.  Dermatofibroma - Firm pink/brown papulenodule with dimple sign at left medial thigh - Benign appearing - Call for any changes  Return in about 1 year (around 11/02/2022) for TBSE.  Graciella Belton, RMA, am acting as scribe for Forest Gleason, MD .  Documentation: I have reviewed the above documentation for accuracy and completeness, and I agree with the above.  Forest Gleason, MD

## 2021-11-08 ENCOUNTER — Encounter: Payer: Self-pay | Admitting: Dermatology

## 2021-12-20 ENCOUNTER — Ambulatory Visit: Payer: Managed Care, Other (non HMO) | Admitting: Dermatology

## 2022-08-05 ENCOUNTER — Other Ambulatory Visit: Payer: Self-pay | Admitting: Obstetrics and Gynecology

## 2022-08-05 DIAGNOSIS — E2839 Other primary ovarian failure: Secondary | ICD-10-CM

## 2022-09-30 ENCOUNTER — Encounter: Payer: Self-pay | Admitting: Obstetrics and Gynecology

## 2022-11-04 ENCOUNTER — Encounter: Payer: Self-pay | Admitting: Dermatology

## 2022-11-04 ENCOUNTER — Ambulatory Visit (INDEPENDENT_AMBULATORY_CARE_PROVIDER_SITE_OTHER): Payer: Managed Care, Other (non HMO) | Admitting: Dermatology

## 2022-11-04 VITALS — BP 149/85 | HR 97

## 2022-11-04 DIAGNOSIS — W908XXA Exposure to other nonionizing radiation, initial encounter: Secondary | ICD-10-CM | POA: Diagnosis not present

## 2022-11-04 DIAGNOSIS — Z1283 Encounter for screening for malignant neoplasm of skin: Secondary | ICD-10-CM

## 2022-11-04 DIAGNOSIS — L821 Other seborrheic keratosis: Secondary | ICD-10-CM

## 2022-11-04 DIAGNOSIS — L570 Actinic keratosis: Secondary | ICD-10-CM | POA: Diagnosis not present

## 2022-11-04 DIAGNOSIS — L280 Lichen simplex chronicus: Secondary | ICD-10-CM | POA: Diagnosis not present

## 2022-11-04 DIAGNOSIS — L578 Other skin changes due to chronic exposure to nonionizing radiation: Secondary | ICD-10-CM

## 2022-11-04 DIAGNOSIS — L814 Other melanin hyperpigmentation: Secondary | ICD-10-CM

## 2022-11-04 DIAGNOSIS — D229 Melanocytic nevi, unspecified: Secondary | ICD-10-CM

## 2022-11-04 DIAGNOSIS — Z86018 Personal history of other benign neoplasm: Secondary | ICD-10-CM

## 2022-11-04 DIAGNOSIS — D1801 Hemangioma of skin and subcutaneous tissue: Secondary | ICD-10-CM

## 2022-11-04 NOTE — Progress Notes (Addendum)
Follow-Up Visit   Subjective  Alexandria Shelton is a 62 y.o. female who presents for the following: Skin Cancer Screening and Full Body Skin Exam  The patient presents for Total-Body Skin Exam (TBSE) for skin cancer screening and mole check. The patient has spots, moles and lesions to be evaluated, some may be new or changing and the patient may have concern these could be cancer.  The following portions of the chart were reviewed this encounter and updated as appropriate: medications, allergies, medical history  Review of Systems:  No other skin or systemic complaints except as noted in HPI or Assessment and Plan.  Objective  Well appearing patient in no apparent distress; mood and affect are within normal limits.  A full examination was performed including scalp, head, eyes, ears, nose, lips, neck, chest, axillae, abdomen, back, buttocks, bilateral upper extremities, bilateral lower extremities, hands, feet, fingers, toes, fingernails, and toenails. All findings within normal limits unless otherwise noted below.   Relevant physical exam findings are noted in the Assessment and Plan.  R wrist, L knee Partially eroded and excoriated erythematous scaly plaque  Top of L shoulder x 1, L cheek x 1, R dorsal hand x 1, nasal dorsum nose x 1, R chest x 1 (5) Erythematous thin papules/macules with gritty scale.     Assessment & Plan   SKIN CANCER SCREENING PERFORMED TODAY.  ACTINIC DAMAGE - Chronic condition, secondary to cumulative UV/sun exposure - diffuse scaly erythematous macules with underlying dyspigmentation - Recommend daily broad spectrum sunscreen SPF 30+ to sun-exposed areas, reapply every 2 hours as needed.  - Staying in the shade or wearing long sleeves, sun glasses (UVA+UVB protection) and wide brim hats (4-inch brim around the entire circumference of the hat) are also recommended for sun protection.  - Call for new or changing lesions.  LENTIGINES, SEBORRHEIC KERATOSES,  HEMANGIOMAS - Benign normal skin lesions - Benign-appearing - Call for any changes  MELANOCYTIC NEVI - Tan-brown and/or pink-flesh-colored symmetric macules and papules - Benign appearing on exam today - Observation - Call clinic for new or changing moles - Recommend daily use of broad spectrum spf 30+ sunscreen to sun-exposed areas.   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  Call if any new or changing lesions are noted between office visits  Lichen simplex chronicus R wrist, L knee  Chronic and persistent, secondary to picking per patient report. Start as arthropod bites or wounds, then scaly skin causes patient to pick and worsen wound  Cleansed skin with Puracyn, applied thin DuoDerm pads Keep covered with vaseline and bandage. Avoid picking to allow healing  AK (actinic keratosis) (5) Top of L shoulder x 1, L cheek x 1, R dorsal hand x 1, nasal dorsum nose x 1, R chest x 1  Actinic keratoses are precancerous spots that appear secondary to cumulative UV radiation exposure/sun exposure over time. They are chronic with expected duration over 1 year. A portion of actinic keratoses will progress to squamous cell carcinoma of the skin. It is not possible to reliably predict which spots will progress to skin cancer and so treatment is recommended to prevent development of skin cancer.  Recommend daily broad spectrum sunscreen SPF 30+ to sun-exposed areas, reapply every 2 hours as needed.  Recommend staying in the shade or wearing long sleeves, sun glasses (UVA+UVB protection) and wide  brim hats (4-inch brim around the entire circumference of the hat). Call for new or changing lesions.   Destruction of lesion - Top of L shoulder x 1, L cheek x 1, R dorsal hand x 1, nasal dorsum nose x 1, R chest x 1 (5) Complexity: simple    Destruction method: cryotherapy   Informed consent: discussed and consent obtained   Timeout:  patient name, date of birth, surgical site, and procedure verified Lesion destroyed using liquid nitrogen: Yes   Region frozen until ice ball extended beyond lesion: Yes   Outcome: patient tolerated procedure well with no complications   Post-procedure details: wound care instructions given    Multiple benign nevi  Lentigines  Actinic elastosis  Seborrheic keratoses  Cherry angioma   Return in about 1 year (around 11/04/2023) for TBSE.  Maylene Roes, CMA, am acting as scribe for Elie Goody, MD .   Documentation: I have reviewed the above documentation for accuracy and completeness, and I agree with the above.  Elie Goody, MD

## 2022-11-04 NOTE — Patient Instructions (Signed)

## 2022-11-06 ENCOUNTER — Encounter: Payer: Managed Care, Other (non HMO) | Admitting: Dermatology

## 2023-04-10 ENCOUNTER — Ambulatory Visit
Admission: RE | Admit: 2023-04-10 | Discharge: 2023-04-10 | Disposition: A | Discharge status: Home / Self Care | Payer: Managed Care, Other (non HMO) | Source: Ambulatory Visit | Attending: Obstetrics and Gynecology | Admitting: Obstetrics and Gynecology

## 2023-04-10 DIAGNOSIS — E2839 Other primary ovarian failure: Secondary | ICD-10-CM

## 2023-08-05 ENCOUNTER — Ambulatory Visit (INDEPENDENT_AMBULATORY_CARE_PROVIDER_SITE_OTHER): Admitting: Dermatology

## 2023-08-05 ENCOUNTER — Encounter: Payer: Self-pay | Admitting: Dermatology

## 2023-08-05 DIAGNOSIS — L821 Other seborrheic keratosis: Secondary | ICD-10-CM

## 2023-08-05 DIAGNOSIS — L814 Other melanin hyperpigmentation: Secondary | ICD-10-CM

## 2023-08-05 DIAGNOSIS — L578 Other skin changes due to chronic exposure to nonionizing radiation: Secondary | ICD-10-CM

## 2023-08-05 DIAGNOSIS — L570 Actinic keratosis: Secondary | ICD-10-CM

## 2023-08-05 DIAGNOSIS — R202 Paresthesia of skin: Secondary | ICD-10-CM

## 2023-08-05 DIAGNOSIS — D1801 Hemangioma of skin and subcutaneous tissue: Secondary | ICD-10-CM | POA: Diagnosis not present

## 2023-08-05 DIAGNOSIS — L304 Erythema intertrigo: Secondary | ICD-10-CM

## 2023-08-05 DIAGNOSIS — R21 Rash and other nonspecific skin eruption: Secondary | ICD-10-CM | POA: Diagnosis not present

## 2023-08-05 DIAGNOSIS — W908XXA Exposure to other nonionizing radiation, initial encounter: Secondary | ICD-10-CM

## 2023-08-05 DIAGNOSIS — D229 Melanocytic nevi, unspecified: Secondary | ICD-10-CM

## 2023-08-05 DIAGNOSIS — Z809 Family history of malignant neoplasm, unspecified: Secondary | ICD-10-CM

## 2023-08-05 MED ORDER — TRIAMCINOLONE ACETONIDE 0.1 % EX OINT: TOPICAL_OINTMENT | CUTANEOUS | 1 refills | Status: AC

## 2023-08-05 MED ORDER — TRIAMCINOLONE ACETONIDE 0.1 % EX CREA: TOPICAL_CREAM | CUTANEOUS | 0 refills | Status: DC

## 2023-08-05 NOTE — Progress Notes (Signed)
 Follow-Up Visit   Subjective  Alexandria Shelton is a 63 y.o. female who presents for the following: Scaly patch on the L jaw, pt concerned and would like checked today, hx of AK. Rash on the R hand/arm x 1 week. Pt has been working outside and applied a solution to her horse's foot and thinks the solution could've splashed on her hand. Pt first noticed itching and then a blister occurred. She does pick at residual rough areas. Irregular skin lesion on the lower leg, pt noticed when shaving, and thought it was irregular appearing. She would like checked today. Pt c/o intermittent rash on the chest that is exacerbated by heat.   Pt here today for skin cancer screening exam.  The following portions of the chart were reviewed this encounter and updated as appropriate: medications, allergies, medical history  Review of Systems:  No other skin or systemic complaints except as noted in HPI or Assessment and Plan.  Objective  Well appearing patient in no apparent distress; mood and affect are within normal limits.  A focused examination was performed of the following areas:   Relevant exam findings are noted in the Assessment and Plan.  R forehead x 1, R jaw line x 1 (2) Erythematous thin papules/macules with gritty scale.   Assessment & Plan   Rash    Exam: Erythematous lichenified plaques on R dorsal hand and R dorsal wrist. Geometric ulceration between plaques   Differential diagnosis:  Irritant dermatitis from chemical solution vs poison ivy exposure  Treatment Plan: Start TMC 0.1% ointment to aa's QD-BID until healed. Topical steroids (such as triamcinolone , fluocinolone, fluocinonide, mometasone, clobetasol, halobetasol, betamethasone, hydrocortisone) can cause thinning and lightening of the skin if they are used for too long in the same area. Your physician has selected the right strength medicine for your problem and area affected on the body. Please use your medication only as  directed by your physician to prevent side effects.   Avoid neosporin or other similar OTC medications as some people develop allergy over time.  LENTIGINES Exam: scattered tan macules Due to sun exposure Treatment Plan: Benign-appearing, observe. Recommend daily broad spectrum sunscreen SPF 30+ to sun-exposed areas, reapply every 2 hours as needed.  Call for any changes  SEBORRHEIC KERATOSIS - Stuck-on, waxy, tan-brown papules and/or plaques  - Benign-appearing - Discussed benign etiology and prognosis. - Observe - Call for any changes  HEMANGIOMA Exam: red papule(s) Discussed benign nature. Recommend observation. Call for changes.  MELANOCYTIC NEVI Exam: Tan-brown and/or pink-flesh-colored symmetric macules and papules  Treatment Plan: Benign appearing on exam today. Recommend observation. Call clinic for new or changing moles. Recommend daily use of broad spectrum spf 30+ sunscreen to sun-exposed areas.   HISTORY OF DYSPLASTIC NEVI - L hip (mild), L buttock (severe), 10/03/14 No evidence of recurrence today Recommend regular full body skin exams Recommend daily broad spectrum sunscreen SPF 30+ to sun-exposed areas, reapply every 2 hours as needed.  Call if any new or changing lesions are noted between office visits  FAMILY HISTORY OF SKIN CANCER What type(s): SCC Who affected: Father AK (ACTINIC KERATOSIS) (2) R forehead x 1, R jaw line x 1 (2) Actinic keratoses are precancerous spots that appear secondary to cumulative UV radiation exposure/sun exposure over time. They are chronic with expected duration over 1 year. A portion of actinic keratoses will progress to squamous cell carcinoma of the skin. It is not possible to reliably predict which spots will progress to skin cancer  and so treatment is recommended to prevent development of skin cancer.  Recommend daily broad spectrum sunscreen SPF 30+ to sun-exposed areas, reapply every 2 hours as needed.  Recommend staying in  the shade or wearing long sleeves, sun glasses (UVA+UVB protection) and wide brim hats (4-inch brim around the entire circumference of the hat). Call for new or changing lesions.  Destruction of lesion - R forehead x 1, R jaw line x 1 (2) Complexity: simple   Destruction method: cryotherapy   Informed consent: discussed and consent obtained   Timeout:  patient name, date of birth, surgical site, and procedure verified Lesion destroyed using liquid nitrogen: Yes   Region frozen until ice ball extended beyond lesion: Yes   Cryo cycles: 1 or 2. Outcome: patient tolerated procedure well with no complications   Post-procedure details: wound care instructions given    INTERTRIGO   MULTIPLE BENIGN NEVI   ACTINIC ELASTOSIS   SEBORRHEIC KERATOSES   LENTIGINES   CHERRY ANGIOMA   NOTALGIA PARESTHETICA   Rash Exam: Erythematous inflamed papules and patches scattered on b/l inframammary skin  Differential diagnosis:  Intertrigo + Miliaria rubra vs Grover's disease  Treatment Plan: Apply TMC to aa's QD-BID PRN. Topical steroids (such as triamcinolone , fluocinolone, fluocinonide, mometasone, clobetasol, halobetasol, betamethasone, hydrocortisone) can cause thinning and lightening of the skin if they are used for too long in the same area. Your physician has selected the right strength medicine for your problem and area affected on the body. Please use your medication only as directed by your physician to prevent side effects.   Ok to continue corn starch powder or Zeasorb AF powder daily under the breast to help control moisture.   NOTALGIA PARESTHETICA Exam: Back clear.  Chronic condition without cure secondary to pinched nerve along spine causing itching or sensation changes in an area of skin. Chronic rubbing or scratching causes darkening of the skin.   OTC treatments which can help with itch include numbing creams like pramoxine or lidocaine which temporarily reduce itch or  Capsaicin-containing creams which cause a burning sensation but which sometimes over time will reset the nerves to stop producing itch.  If you choose to use Capsaicin cream, it is recommended to use it 5 times daily for 1 week followed by 3 times daily for 3-6 weeks. You may have to continue using it long-term.  If not doing well with OTC options, could consider [facility] compounded prescription anti-itch cream with Amitriptyline 5% / Lidocaine 5% / Pramoxine 1% or Amitriptyline 5% / Gabapentin 10% / Lidocaine 5% Cream or other prescription cream or pill options or lidocaine patch or cream over the counter.  Return in about 1 year (around 08/04/2024) for TBSE.  LILLETTE Rosina Mayans, CMA, am acting as scribe for Boneta Sharps, MD .  Documentation: I have reviewed the above documentation for accuracy and completeness, and I agree with the above.  Boneta Sharps, MD

## 2023-08-05 NOTE — Patient Instructions (Addendum)
 May use over the counter Zeasorb powder to help with dampness under the breast.  Due to recent changes in healthcare laws, you may see results of your pathology and/or laboratory studies on MyChart before the doctors have had a chance to review them. We understand that in some cases there may be results that are confusing or concerning to you. Please understand that not all results are received at the same time and often the doctors may need to interpret multiple results in order to provide you with the best plan of care or course of treatment. Therefore, we ask that you please give us  2 business days to thoroughly review all your results before contacting the office for clarification. Should we see a critical lab result, you will be contacted sooner.   If You Need Anything After Your Visit  If you have any questions or concerns for your doctor, please call our main line at [phone] and press option 4 to reach your doctor's medical assistant. If no one answers, please leave a voicemail as directed and we will return your call as soon as possible. Messages left after 4 pm will be answered the following business day.   You may also send us  a message via MyChart. We typically respond to MyChart messages within 1-2 business days.  For prescription refills, please ask your pharmacy to contact our office. Our fax number is [phone].  If you have an urgent issue when the clinic is closed that cannot wait until the next business day, you can page your doctor at the number below.    Please note that while we do our best to be available for urgent issues outside of office hours, we are not available 24/7.   If you have an urgent issue and are unable to reach us , you may choose to seek medical care at your doctor's office, retail clinic, urgent care center, or emergency room.  If you have a medical emergency, please immediately call 911 or go to the emergency department.  Pager Numbers  - Dr.  Hester: [phone]  - Dr. Jackquline: [phone]  - Dr. Claudene: [phone]   In the event of inclement weather, please call our main line at [phone] for an update on the status of any delays or closures.  Dermatology Medication Tips: Please keep the boxes that topical medications come in in order to help keep track of the instructions about where and how to use these. Pharmacies typically print the medication instructions only on the boxes and not directly on the medication tubes.   If your medication is too expensive, please contact our office at [phone] option 4 or send us  a message through MyChart.   We are unable to tell what your co-pay for medications will be in advance as this is different depending on your insurance coverage. However, we may be able to find a substitute medication at lower cost or fill out paperwork to get insurance to cover a needed medication.   If a prior authorization is required to get your medication covered by your insurance company, please allow us  1-2 business days to complete this process.  Drug prices often vary depending on where the prescription is filled and some pharmacies may offer cheaper prices.  The website www.goodrx.com contains coupons for medications through different pharmacies. The prices here do not account for what the cost may be with help from insurance (it may be cheaper with your insurance), but the website can give you the price if you  did not use any insurance.  - You can print the associated coupon and take it with your prescription to the pharmacy.  - You may also stop by our office during regular business hours and pick up a GoodRx coupon card.  - If you need your prescription sent electronically to a different pharmacy, notify our office through [facility] or by phone at [phone] option 4.     Si Usted Necesita Algo Despus de Su Visita  Tambin puede enviarnos un mensaje a travs de Clinical cytogeneticist. Por lo  general respondemos a los mensajes de MyChart en el transcurso de 1 a 2 das hbiles.  Para renovar recetas, por favor pida a su farmacia que se ponga en contacto con nuestra oficina. Randi lakes de fax es [place] [phone].  Si tiene un asunto urgente cuando la clnica est cerrada y que no puede esperar hasta el siguiente da hbil, puede llamar/localizar a su doctor(a) al nmero que aparece a continuacin.   Por favor, tenga en cuenta que aunque hacemos todo lo posible para estar disponibles para asuntos urgentes fuera del horario de [place], no estamos disponibles las 24 horas del da, los 7 [address]  [address] de la [place].   Si tiene un problema urgente y no puede comunicarse con nosotros, puede optar por buscar atencin mdica  en el consultorio de su doctor(a), en una clnica privada, en un centro de atencin urgente o en una sala de emergencias.  Si tiene Engineer, drilling, por favor llame inmediatamente al 911 o vaya a la sala de emergencias.  Nmeros de bper  - Dr. Hester: [phone]  - Dra. Jackquline: [phone]  - Dr. Claudene: [phone]   En caso de inclemencias del tiempo, por favor llame a landry capes principal al [phone] para una actualizacin sobre el [place] de cualquier retraso o cierre.  Consejos para la medicacin en dermatologa: Por favor, guarde las cajas en las que vienen los medicamentos de uso tpico para ayudarle a seguir las instrucciones sobre dnde y cmo usarlos. Las farmacias generalmente imprimen las instrucciones del medicamento slo en las cajas y no directamente en los tubos del [place].   Si su medicamento es muy caro, por favor, pngase en contacto con landry rieger llamando al [phone] y presione la opcin 4 o envenos un mensaje a travs de Clinical cytogeneticist.   No podemos decirle cul ser su copago por los medicamentos por adelantado ya que esto es diferente dependiendo de la cobertura de su seguro. Sin embargo, es posible que podamos encontrar  un medicamento sustituto a Audiological scientist un formulario para que el seguro cubra el medicamento que se considera necesario.   Si se requiere una autorizacin previa para que su compaa de seguros malta su medicamento, por favor permtanos de 1 a 2 das hbiles para completar este proceso.  Los precios de los medicamentos varan con frecuencia dependiendo del Environmental consultant de dnde se surte la receta y alguna farmacias pueden ofrecer precios ms baratos.  El sitio web www.goodrx.com tiene cupones para medicamentos de Health and safety inspector. Los precios aqu no tienen en cuenta lo que podra costar con la ayuda del seguro (puede ser ms barato con su seguro), pero el sitio web puede darle el precio si no utiliz Tourist information centre manager.  - Puede imprimir el cupn correspondiente y llevarlo con su receta a la farmacia.  - Tambin puede pasar por nuestra oficina durante el horario de atencin regular y Education officer, museum una tarjeta de cupones de GoodRx.  - Si necesita que su receta  se enve electrnicamente a una farmacia diferente, informe a nuestra oficina a travs de MyChart de [place] o por telfono llamando al [phone] y presione la opcin 4.

## 2023-11-04 ENCOUNTER — Ambulatory Visit: Payer: Managed Care, Other (non HMO) | Admitting: Dermatology

## 2024-08-05 ENCOUNTER — Ambulatory Visit: Admitting: Dermatology
